# Patient Record
Sex: Female | Born: 2000 | Race: White | Hispanic: No | Marital: Single | Smoking: Never smoker
Health system: Southern US, Community
[De-identification: ages and names within clinical notes are randomized; demographics above are authoritative.]

---
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
## 2020-10-29 ENCOUNTER — Ambulatory Visit: Payer: BC Managed Care – PPO | Admitting: Nurse Practitioner
